# Patient Record
Sex: Male | Born: 1975 | Hispanic: Yes | Marital: Married | Smoking: Current some day smoker
Health system: Southern US, Community
[De-identification: ages and names within clinical notes are randomized; demographics above are authoritative.]

## PROBLEM LIST (undated history)

## (undated) DIAGNOSIS — I10 Essential (primary) hypertension: Secondary | ICD-10-CM

## (undated) HISTORY — PX: APPENDECTOMY: SHX54

---

## 2012-07-09 ENCOUNTER — Emergency Department: Payer: Self-pay | Admitting: Internal Medicine

## 2013-02-07 ENCOUNTER — Emergency Department: Payer: Self-pay | Admitting: Emergency Medicine

## 2013-02-12 ENCOUNTER — Emergency Department: Payer: Self-pay | Admitting: Emergency Medicine

## 2015-03-15 ENCOUNTER — Emergency Department (HOSPITAL_COMMUNITY)
Admission: EM | Admit: 2015-03-15 | Discharge: 2015-03-15 | Disposition: A | Discharge status: Home / Self Care | Payer: Worker's Compensation | Attending: Emergency Medicine | Admitting: Emergency Medicine

## 2015-03-15 ENCOUNTER — Emergency Department (HOSPITAL_COMMUNITY): Payer: Worker's Compensation

## 2015-03-15 ENCOUNTER — Encounter (HOSPITAL_COMMUNITY): Payer: Self-pay

## 2015-03-15 DIAGNOSIS — S29001A Unspecified injury of muscle and tendon of front wall of thorax, initial encounter: Secondary | ICD-10-CM | POA: Diagnosis present

## 2015-03-15 DIAGNOSIS — S50311A Abrasion of right elbow, initial encounter: Secondary | ICD-10-CM | POA: Diagnosis not present

## 2015-03-15 DIAGNOSIS — Y998 Other external cause status: Secondary | ICD-10-CM | POA: Insufficient documentation

## 2015-03-15 DIAGNOSIS — S2231XA Fracture of one rib, right side, initial encounter for closed fracture: Secondary | ICD-10-CM | POA: Diagnosis not present

## 2015-03-15 DIAGNOSIS — Y92410 Unspecified street and highway as the place of occurrence of the external cause: Secondary | ICD-10-CM | POA: Diagnosis not present

## 2015-03-15 DIAGNOSIS — F17200 Nicotine dependence, unspecified, uncomplicated: Secondary | ICD-10-CM | POA: Insufficient documentation

## 2015-03-15 DIAGNOSIS — R0602 Shortness of breath: Secondary | ICD-10-CM | POA: Insufficient documentation

## 2015-03-15 DIAGNOSIS — Y9389 Activity, other specified: Secondary | ICD-10-CM | POA: Insufficient documentation

## 2015-03-15 LAB — I-STAT CHEM 8, ED
BUN: 18 mg/dL (ref 6–20)
CALCIUM ION: 1.19 mmol/L (ref 1.12–1.23)
CHLORIDE: 104 mmol/L (ref 101–111)
CREATININE: 0.8 mg/dL (ref 0.61–1.24)
GLUCOSE: 114 mg/dL — AB (ref 65–99)
HCT: 46 % (ref 39.0–52.0)
Hemoglobin: 15.6 g/dL (ref 13.0–17.0)
Potassium: 4.3 mmol/L (ref 3.5–5.1)
SODIUM: 139 mmol/L (ref 135–145)
TCO2: 22 mmol/L (ref 0–100)

## 2015-03-15 MED ORDER — IBUPROFEN 800 MG PO TABS: 800.0000 mg | ORAL_TABLET | Freq: Three times a day (TID) | ORAL | Status: DC

## 2015-03-15 MED ORDER — MORPHINE SULFATE (PF) 4 MG/ML IV SOLN
4.0000 mg | Freq: Once | INTRAVENOUS | Status: AC
  Administered 2015-03-15: 4 mg via INTRAVENOUS
  Filled 2015-03-15: qty 1

## 2015-03-15 MED ORDER — KETOROLAC TROMETHAMINE 15 MG/ML IJ SOLN
15.0000 mg | Freq: Once | INTRAMUSCULAR | Status: AC
  Administered 2015-03-15: 15 mg via INTRAVENOUS
  Filled 2015-03-15: qty 1

## 2015-03-15 MED ORDER — HYDROCODONE-ACETAMINOPHEN 5-325 MG PO TABS: 2.0000 | ORAL_TABLET | ORAL | Status: DC | PRN

## 2015-03-15 NOTE — ED Notes (Signed)
Pt verbalized understanding of d/c instructions, prescriptions, and follow-up care. No further questions/concerns, VSS, ambulatory w/ steady gait (refused wheelchair) 

## 2015-03-15 NOTE — Discharge Instructions (Signed)
Take your medications as prescribed as needed for pain relief. I also recommend continuing to use your incentive spirometer multiple times daily. Please follow up with a primary care provider from the Resource Guide provided below in 1 week. Please return to the Emergency Department if symptoms worsen or new onset of fever, chest pain, difficulty breathing, cough, vomiting, coughing/vomiting blood.   Emergency Department Resource Guide 1) Find a Doctor and Pay Out of Pocket Although you won't have to find out who is covered by your insurance plan, it is a good idea to ask around and get recommendations. You will then need to call the office and see if the doctor you have chosen will accept you as a new patient and what types of options they offer for patients who are self-pay. Some doctors offer discounts or will set up payment plans for their patients who do not have insurance, but you will need to ask so you aren't surprised when you get to your appointment.  2) Contact Your Local Health Department Not all health departments have doctors that can see patients for sick visits, but many do, so it is worth a call to see if yours does. If you don't know where your local health department is, you can check in your phone book. The CDC also has a tool to help you locate your state's health department, and many state websites also have listings of all of their local health departments.  3) Find a Walk-in Clinic If your illness is not likely to be very severe or complicated, you may want to try a walk in clinic. These are popping up all over the country in pharmacies, drugstores, and shopping centers. They're usually staffed by nurse practitioners or physician assistants that have been trained to treat common illnesses and complaints. They're usually fairly quick and inexpensive. However, if you have serious medical issues or chronic medical problems, these are probably not your best option.  No Primary Care  Doctor: - Call Health Connect at  [phone] - they can help you locate a primary care doctor that  accepts your insurance, provides certain services, etc. - Physician Referral Service- [phone]  Chronic Pain Problems: Organization         Address  Phone   Notes  [facility]  [phone] Patients need to be referred by their primary care doctor.   Medication Assistance: Organization         Address  Phone   Notes  [facility] [address] [place], [place] [id number] [phone]) [phone] --Must be a resident of [facility] [place] -- Must have NO insurance coverage whatsoever (no Medicaid/ Medicare, etc.) -- The pt. MUST have a primary care doctor that directs their care regularly and follows them in the community   MedAssist  [phone]   [employer]  [phone]    Agencies that provide inexpensive medical care: Organization         Address  Phone   Notes  [facility]  [phone]) [phone]   [facility]    [phone]) [phone]   [facility] [address] [place], [place] [address] [phone]   [facility] [address] [place], [place] [phone]) [phone]   [facility]anned Parenthood    [phone](336) [phone]   [facility]    [phone]   [facility]  [address], [place] Phone:  [phone], Fax:  [phone] Hours of Operation:  9 am - 6 pm, M-F.  Also accepts Medicaid/Medicare and self-pay.  [facility] for [place] [address], [address], [place] Phone: [phone], Fax: [phone]. Hours of Operation:  8:30 am - 5:30 pm, M-F.  Also accepts Medicaid and self-pay.  [facility] [place] [address], [place] Phone: [phone]   [place], [place], [place] [phone], Ext. [phone] Mondays & Thursdays: 7-9 AM.  First 15 patients are seen on a first  come, first serve basis.    [place] Providers:  Organization         Address  Phone   Notes  [facility] [address], [place] [phone] Also accepts self-pay patients.  [facility] [id number] [place], [place]  [phone]   [place], [address], [place] [phone]   [facility] [address], [place] [phone]   Lucianne Lei [address], [place]   [phone] Only accepts [place] Access [place] patients after they have their name applied to their card.   Self-Pay (no insurance) in [facility]:  Organization         Address  Phone   Notes  Sickle Cell Patients, [facility] [place] [phone]   [facility] [place] [phone]   [facility] [place]  [place], [place], [place] [phone]   [facility]/Dr. Osei-Bonsu  [address], [place] or [address], [place] [phone] Phone number for both [place] and [place] locations is the same.  [facility]gent [facility] [address] [place] [phone]   [facility] [address] [place] or [address] [phone]   [facility] [address] [place] [phone], phone; [phone], fax Sees patients 1st and 3rd Saturday of every month.  Must not qualify for public or private insurance (i.e. Medicaid, Medicare, [place] Health Choice, Veterans' Benefits)  Household income should be no more than 200% of the poverty level The clinic cannot treat you if you are pregnant or think you are pregnant  Sexually transmitted diseases are not treated at the clinic.    Dental Care: Organization          Address  Phone  Notes  [facility] [phone] Accepts children up to age 79 who are enrolled in [place] or [place]; pregnant women with a Medicaid card; and children who have applied for Medicaid or [place] Health Choice, but were declined, whose parents can pay a reduced fee at time of service.  [facility]  [address], [place] [phone] Accepts children up to age 29 who are enrolled in [place] or [place]; pregnant women with a Medicaid card; and children who have applied for Medicaid or [place] Health Choice, but were declined, whose parents can pay a reduced fee at time of service.  [facility]  [place] [phone] Patients are seen by appointment only. Walk-ins  are not accepted. [place] will see patients 83 years of age and older. Monday - Tuesday (8am-5pm) Most Wednesdays (8:30-5pm) $30 per visit, cash only  [facility] Access PRO[facility]  [address], [facility] [phone] Patients are seen by appointment only. Walk-ins are not accepted. [place] will see patients 74 years of age and older. One Wednesday Evening (Monthly: Volunteer Based).  $30 per visit, cash only  [place]  [phone] for adults; Children under age 26, call Graduate Pediatric Dentistry at [phone]. Children aged 68-14, please call [phone] to request a pediatric application.  Dental services are provided in all areas of dental care including fillings, crowns and bridges, complete and partial dentures, implants, gum treatment, root canals, and extractions. Preventive care is also provided. Treatment is provided to both adults and children. Patients are selected via a lottery and there is often a waiting list.   [facility] [address] [place]  [phone] [email or web address]   [facility] [address] [place], [place] [phone], Ext. [phone] Second and Fourth Thursday of each month, opens at 6:30 AM; Clinic ends at 9 AM.  Patients are seen on a first-come first-served basis, and a limited number are seen during each clinic.   [facility]  [address] Hillard Danker [place], [place] [phone]   Eligibility Requirements You must have lived in [place], [place], or [place] counties for at least the last three months.   You cannot be eligible for state or federal sponsored [employer], including [employer], [place], or [employer].   You generally cannot be eligible for healthcare insurance through your employer.    How to apply: Eligibility screenings are held every Tuesday and Wednesday afternoon from 1:00 pm until 4:00 pm. You do not need an appointment for the interview!  [facility] [address] [place], [place]   [place]  [place] Department  [place]  [phone]    Behavioral Health Resources in the Community: Intensive Outpatient Programs Organization         Address  Phone  Notes  [place]. [address] [place], [place] [phone]   [facility]patient [address] [place], [place]   ADS: Alcohol & Drug Svcs [address] [place], [place]   [place] [address],  [place], [place] or [phone]   Substance Abuse Resources Organization         Address  Phone  Notes  Alcohol and Drug Services  [phone]   [place]  [phone]   [facility] [place]   Chinita Pester  [phone]   Residential & Outpatient Substance Abuse Program  [phone]   Psychological  Services Organization         Address  Phone  Notes  [facility] [place]  [place]  [phone]   [place] [address] [place] [phone] or [phone]    Mobile Crisis Teams Organization         Address  Phone  Notes  [facility], Mobile Crisis Care Unit  [phone]   [facility]ive Psychotherapeutic Services  [address] [place], [place]   [facility] [address] [place] [phone]  Self-Help/Support Groups Organization         Address  Phone             Notes  [facility] of [place] - variety of support groups  [place] Call for more information  Narcotics Anonymous (NA), [facility] [address], [employer]   2 meetings at this location   Special educational needs teacher         Address  Phone  Notes  ASAP Residential Treatment [place],    [place]  [phone]   [facility]  [address] [place] [id number], [place], [place]   [place], [place] [phone] Admissions: 8am-3pm M-F  Incentives Substance [place] [address],    [place], [place] [phone]   [facility] [address] [place], [place], [place]   [facility] [address],  [place], [place]   [facility] Programs - Intensive Outpatient [address] Krist[address], [place], [place]   [facility] ([place] [address] [place].,  [place], [place] [phone] or [phone]   [facility] Services (RTS) [address] [place], [place] Accepts Medicaid  [facility] [place] [address],  [place] [phone] Substance Abuse/Addiction Treatment   [facility] Organization         Address  Phone  Notes  [facility]  [phone]   Domenic Schwab, PhD [address] Arlis Porta [place], [place]   [phone] or [phone]   [place], [place] [phone]   [facility] [address], [place], [place] [phone] Insurance/Medicaid/sponsorship through [facility] and Families [address], [address]                                    [place], [place] [phone] [place] [address], [place] [phone]    Dr. Adele Schilder  [phone]   [facility] [place] Dept. 1) [address],  2) [place] 3)  [place] [address], [place] [phone]  [phone]   [place] [phone] or [phone] (After Hours)
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
## 2015-03-24 ENCOUNTER — Emergency Department (HOSPITAL_COMMUNITY)
Admission: EM | Admit: 2015-03-24 | Discharge: 2015-03-24 | Disposition: A | Discharge status: Home / Self Care | Payer: Worker's Compensation | Attending: Emergency Medicine | Admitting: Emergency Medicine

## 2015-03-24 ENCOUNTER — Emergency Department (HOSPITAL_COMMUNITY): Payer: Worker's Compensation

## 2015-03-24 ENCOUNTER — Encounter (HOSPITAL_COMMUNITY): Payer: Self-pay | Admitting: Emergency Medicine

## 2015-03-24 DIAGNOSIS — F17200 Nicotine dependence, unspecified, uncomplicated: Secondary | ICD-10-CM | POA: Insufficient documentation

## 2015-03-24 DIAGNOSIS — S3992XD Unspecified injury of lower back, subsequent encounter: Secondary | ICD-10-CM | POA: Insufficient documentation

## 2015-03-24 DIAGNOSIS — Z791 Long term (current) use of non-steroidal anti-inflammatories (NSAID): Secondary | ICD-10-CM | POA: Insufficient documentation

## 2015-03-24 DIAGNOSIS — S29001D Unspecified injury of muscle and tendon of front wall of thorax, subsequent encounter: Secondary | ICD-10-CM | POA: Diagnosis present

## 2015-03-24 DIAGNOSIS — S2231XD Fracture of one rib, right side, subsequent encounter for fracture with routine healing: Secondary | ICD-10-CM | POA: Insufficient documentation

## 2015-03-24 MED ORDER — HYDROCODONE-ACETAMINOPHEN 5-325 MG PO TABS
1.0000 | ORAL_TABLET | ORAL | Status: DC | PRN
Start: 2015-03-24 — End: 2015-04-21
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
## 2015-03-24 NOTE — Discharge Instructions (Signed)
Take the prescribed medication as directed.  Your bruit fracture is healing, it will take several weeks before it is back to normal. You can expect to have some soreness during this time which is normal. Follow-up with your primary care physician. Return to the ED for new or worsening symptoms.  Rib Fracture A rib fracture is a break or crack in one of the bones of the ribs. The ribs are a group of long, curved bones that wrap around your chest and attach to your spine. They protect your lungs and other organs in the chest cavity. A broken or cracked rib is often painful, but most do not cause other problems. Most rib fractures heal on their own over time. However, rib fractures can be more serious if multiple ribs are broken or if broken ribs move out of place and push against other structures. CAUSES   A direct blow to the chest. For example, this could happen during contact sports, a car accident, or a fall against a hard object.  Repetitive movements with high force, such as pitching a baseball or having severe coughing spells. SYMPTOMS   Pain when you breathe in or cough.  Pain when someone presses on the injured area. DIAGNOSIS  Your caregiver will perform a physical exam. Various imaging tests may be ordered to confirm the diagnosis and to look for related injuries. These tests may include a chest X-ray, computed tomography (CT), magnetic resonance imaging (MRI), or a bone scan. TREATMENT  Rib fractures usually heal on their own in 1-3 months. The longer healing period is often associated with a continued cough or other aggravating activities. During the healing period, pain control is very important. Medication is usually given to control pain. Hospitalization or surgery may be needed for more severe injuries, such as those in which multiple ribs are broken or the ribs have moved out of place.  HOME CARE INSTRUCTIONS   Avoid strenuous activity and any activities or movements that cause  pain. Be careful during activities and avoid bumping the injured rib.  Gradually increase activity as directed by your caregiver.  Only take over-the-counter or prescription medications as directed by your caregiver. Do not take other medications without asking your caregiver first.  Apply ice to the injured area for the first 1-2 days after you have been treated or as directed by your caregiver. Applying ice helps to reduce inflammation and pain.  Put ice in a plastic bag.  Place a towel between your skin and the bag.   Leave the ice on for 15-20 minutes at a time, every 2 hours while you are awake.  Perform deep breathing as directed by your caregiver. This will help prevent pneumonia, which is a common complication of a broken rib. Your caregiver may instruct you to:  Take deep breaths several times a day.  Try to cough several times a day, holding a pillow against the injured area.  Use a device called an incentive spirometer to practice deep breathing several times a day.  Drink enough fluids to keep your urine clear or pale yellow. This will help you avoid constipation.   Do not wear a rib belt or binder. These restrict breathing, which can lead to pneumonia.  SEEK IMMEDIATE MEDICAL CARE IF:   You have a fever.   You have difficulty breathing or shortness of breath.   You develop a continual cough, or you cough up thick or bloody sputum.  You feel sick to your stomach (nausea), throw  up (vomit), or have abdominal pain.   You have worsening pain not controlled with medications.  MAKE SURE YOU:  Understand these instructions.  Will watch your condition.  Will get help right away if you are not doing well or get worse.   This information is not intended to replace advice given to you by your health care provider. Make sure you discuss any questions you have with your health care provider.   Document Released: 04/01/2005 Document Revised: 12/02/2012 Document  Reviewed: 06/03/2012 Elsevier Interactive Patient Education [employer]2016 Elsevier Inc.
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
## 2015-03-24 NOTE — ED Notes (Signed)
This RN went in to try to discharge the patient.  Patient's wife is speaking through her husband, and is wanting mulitiple questions answered.  PA made aware and at bedside updating family at this time.

## 2015-03-24 NOTE — ED Notes (Signed)
Pt able to ambulate independently 

## 2015-03-27 ENCOUNTER — Ambulatory Visit: Payer: Worker's Compensation | Attending: Family Medicine | Admitting: Family Medicine

## 2015-03-27 ENCOUNTER — Encounter: Payer: Self-pay | Admitting: Family Medicine

## 2015-03-27 VITALS — BP 164/122 | HR 101 | Temp 97.9°F | Resp 15 | Ht 71.0 in | Wt 201.6 lb

## 2015-03-27 DIAGNOSIS — S2231XA Fracture of one rib, right side, initial encounter for closed fracture: Secondary | ICD-10-CM | POA: Diagnosis not present

## 2015-03-27 DIAGNOSIS — Z0000 Encounter for general adult medical examination without abnormal findings: Secondary | ICD-10-CM | POA: Insufficient documentation

## 2015-03-27 DIAGNOSIS — R0789 Other chest pain: Secondary | ICD-10-CM | POA: Insufficient documentation

## 2015-03-27 DIAGNOSIS — F17210 Nicotine dependence, cigarettes, uncomplicated: Secondary | ICD-10-CM | POA: Diagnosis not present

## 2015-03-27 DIAGNOSIS — IMO0001 Reserved for inherently not codable concepts without codable children: Secondary | ICD-10-CM

## 2015-03-27 DIAGNOSIS — R030 Elevated blood-pressure reading, without diagnosis of hypertension: Secondary | ICD-10-CM | POA: Diagnosis not present

## 2015-03-27 DIAGNOSIS — M79601 Pain in right arm: Secondary | ICD-10-CM | POA: Diagnosis not present

## 2015-03-27 DIAGNOSIS — R0781 Pleurodynia: Secondary | ICD-10-CM | POA: Diagnosis not present

## 2015-03-27 DIAGNOSIS — M5441 Lumbago with sciatica, right side: Secondary | ICD-10-CM

## 2015-03-27 DIAGNOSIS — M545 Low back pain: Secondary | ICD-10-CM | POA: Diagnosis not present

## 2015-03-27 MED ORDER — ACETAMINOPHEN-CODEINE #3 300-30 MG PO TABS: 1.0000 | ORAL_TABLET | Freq: Four times a day (QID) | ORAL | Status: AC | PRN

## 2015-03-27 MED ORDER — METHOCARBAMOL 750 MG PO TABS: 750.0000 mg | ORAL_TABLET | Freq: Three times a day (TID) | ORAL | Status: AC | PRN
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
## 2015-03-27 NOTE — Patient Instructions (Signed)
Fracturas de costilla  (Rib Fracture)  Una fractura de costilla es la ruptura de uno de los huesos que la forman. Las costillas son un grupo de huesos largos y curvos que rodean el pecho y estn adheridos a la columna vertebral. Protegen a los pulmones y a otros rganos que se encuentran en la cavidad torcica. La fractura o fisura de una costilla puede ser dolorosa pero no causa otros problemas. La mayora de las fracturas de costillas se curan por s mismas luego de un tiempo. Sin embargo, puede llegar a ser ms grave si se rompen varias costillas o si se desplazan y empujan otras estructuras. CAUSAS   Un golpe directo en el pecho. Por ejemplo, esto puede ocurrir durante la prctica de deportes de contacto, un accidente automovilstico o una cada sobre un objeto duro.  Los movimientos repetitivos con una gran fuerza, como al lanzar una pelota en el bisbol, o tener episodios de tos intensos. SNTOMAS   Dolor al respirar o al toser.  Dolor cuando alguien presiona la zona lesionada. DIAGNSTICO  El mdico le har un examen fsico. Le indicar diferentes estudios por imgenes para confirmar el diagnstico y buscar lesiones relacionadas. Estos estudios incluyen radiografas de trax, tomografa computada (TC), resonancia magntica (MRI) o gammagrafa sea.  TRATAMIENTO  La mayora de las fracturas de costillas se curan por s mismas en 1 a 3 meses. Los perodos de curacin ms prolongados generalmente se asocian a tos continua o a otras actividades que agravan el problema. Durante el perodo de curacin es muy importante el control del dolor. Generalmente se recetan medicamentos para controlar el dolor. Ser necesaria la hospitalizacin o una ciruga si hay lesiones ms graves, como las fracturas de mltiples costillas o aquellas en las que las costillas se desplazan.  INSTRUCCIONES PARA EL CUIDADO EN EL HOGAR   Evite las actividades extenuantes y toda actividad o movimiento que le cause dolor.  Realice las actividades con cuidado y evite golpearse las costillas lesionadas.  Reanude la actividad sexual cuando le indique su mdico.  Tome slo medicamentos de venta libre o recetados, segn las indicaciones del mdico. No tome otros medicamentos sin consultar a su mdico.  Aplique hielo en la zona lesionada durante los primeros 1 a 2 das despus de haber recibido tratamiento o segn las indicaciones de su mdico. La aplicacin del hielo ayuda a reducir la inflamacin y el dolor.  Ponga el hielo en una bolsa plstica.  Colquese una toalla entre la piel y la bolsa de hielo.   Deje el hielo durante 15 a 20 minutos, cada 2 horas mientras se encuentre despierto.  Haga ejercicios de respiracin como le indique su mdico. Esto ayudar a evitar la neumona, que es una complicacin frecuente en las fracturas de costilla. El mdico le indicar que:  Haga respiraciones profundas varias veces al da.  Trate de toser varias veces al da, sosteniendo el rea lesionada con una almohada.  Use un dispositivo llamado espirmetro incentivo para realizar respiraciones profundas varias veces por da.  Beba suficiente lquido para mantener la orina clara o de color amarillo plido. Esto le ayudar a evitar el estreimiento.   No use cinturones ni sujetadores. Esta limitacin de la respiracin puede causar neumona.  SOLICITE ATENCIN MDICA DE INMEDIATO SI:   Tiene fiebre.  Tiene dificultad para respirar o le falta el aire.   Tiene tos continua o elimina moco espeso o sanguinolento.  Tiene malestar estomacal (nuseas), devuelve (vomita), o tiene dolor abdominal.   El dolor   falta el aire.    Tiene tos continua o elimina moco espeso o sanguinolento.   Tiene malestar estomacal (nuseas), devuelve (vomita), o tiene dolor abdominal.    El dolor aumenta y no se alivia con los medicamentos.   ASEGRESE DE QUE:    Comprende estas instrucciones.   Controlar su enfermedad.   Recibir ayuda de inmediato si no mejora o si empeora.     Esta informacin no tiene como fin reemplazar el consejo del mdico. Asegrese de hacerle al mdico cualquier pregunta que tenga.      Document Released: 01/09/2005 Document Revised: 12/02/2012  Elsevier Interactive Patient Education 2016 Elsevier Inc.
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
## 2015-04-18 ENCOUNTER — Ambulatory Visit: Payer: Self-pay | Admitting: Family Medicine

## 2015-04-21 ENCOUNTER — Ambulatory Visit: Payer: Self-pay | Attending: Family Medicine | Admitting: Family Medicine

## 2015-04-21 ENCOUNTER — Encounter: Payer: Self-pay | Admitting: Family Medicine

## 2015-04-21 VITALS — BP 158/130 | HR 93 | Temp 97.9°F | Resp 13 | Ht 71.0 in | Wt 207.8 lb

## 2015-04-21 DIAGNOSIS — Z720 Tobacco use: Secondary | ICD-10-CM

## 2015-04-21 DIAGNOSIS — M5431 Sciatica, right side: Secondary | ICD-10-CM

## 2015-04-21 DIAGNOSIS — F17200 Nicotine dependence, unspecified, uncomplicated: Secondary | ICD-10-CM | POA: Insufficient documentation

## 2015-04-21 DIAGNOSIS — M5441 Lumbago with sciatica, right side: Secondary | ICD-10-CM | POA: Insufficient documentation

## 2015-04-21 DIAGNOSIS — M5430 Sciatica, unspecified side: Secondary | ICD-10-CM | POA: Insufficient documentation

## 2015-04-21 DIAGNOSIS — S2231XS Fracture of one rib, right side, sequela: Secondary | ICD-10-CM | POA: Insufficient documentation

## 2015-04-21 DIAGNOSIS — S2231XA Fracture of one rib, right side, initial encounter for closed fracture: Secondary | ICD-10-CM

## 2015-04-21 DIAGNOSIS — I10 Essential (primary) hypertension: Secondary | ICD-10-CM

## 2015-04-21 MED ORDER — AMLODIPINE BESYLATE 10 MG PO TABS: 10.0000 mg | ORAL_TABLET | Freq: Every day | ORAL | Status: AC
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
## 2015-04-21 NOTE — Progress Notes (Signed)
Patient here for follow up He reports right sided lower back pain radiating into leg Right sided rib pain that radiates into his back He reports medications have helped a little but pain is still present He is still out of work/worker's compensation

## 2015-04-21 NOTE — Patient Instructions (Signed)
Hypertension Hypertension, commonly called high blood pressure, is when the force of blood pumping through your arteries is too strong. Your arteries are the blood vessels that carry blood from your heart throughout your body. A blood pressure reading consists of a higher number over a lower number, such as 110/72. The higher number (systolic) is the pressure inside your arteries when your heart pumps. The lower number (diastolic) is the pressure inside your arteries when your heart relaxes. Ideally you want your blood pressure below 120/80. Hypertension forces your heart to work harder to pump blood. Your arteries may become narrow or stiff. Having untreated or uncontrolled hypertension can cause heart attack, stroke, kidney disease, and other problems. RISK FACTORS Some risk factors for high blood pressure are controllable. Others are not.  Risk factors you cannot control include:   Race. You may be at higher risk if you are African American.  Age. Risk increases with age.  Gender. Men are at higher risk than women before age 45 years. After age 65, women are at higher risk than men. Risk factors you can control include:  Not getting enough exercise or physical activity.  Being overweight.  Getting too much fat, sugar, calories, or salt in your diet.  Drinking too much alcohol. SIGNS AND SYMPTOMS Hypertension does not usually cause signs or symptoms. Extremely high blood pressure (hypertensive crisis) may cause headache, anxiety, shortness of breath, and nosebleed. DIAGNOSIS To check if you have hypertension, your health care provider will measure your blood pressure while you are seated, with your arm held at the level of your heart. It should be measured at least twice using the same arm. Certain conditions can cause a difference in blood pressure between your right and left arms. A blood pressure reading that is higher than normal on one occasion does not mean that you need treatment. If  it is not clear whether you have high blood pressure, you may be asked to return on a different day to have your blood pressure checked again. Or, you may be asked to monitor your blood pressure at home for 1 or more weeks. TREATMENT Treating high blood pressure includes making lifestyle changes and possibly taking medicine. Living a healthy lifestyle can help lower high blood pressure. You may need to change some of your habits. Lifestyle changes may include:  Following the DASH diet. This diet is high in fruits, vegetables, and whole grains. It is low in salt, red meat, and added sugars.  Keep your sodium intake below 2,300 mg per day.  Getting at least 30-45 minutes of aerobic exercise at least 4 times per week.  Losing weight if necessary.  Not smoking.  Limiting alcoholic beverages.  Learning ways to reduce stress. Your health care provider may prescribe medicine if lifestyle changes are not enough to get your blood pressure under control, and if one of the following is true:  You are 18-59 years of age and your systolic blood pressure is above 140.  You are 60 years of age or older, and your systolic blood pressure is above 150.  Your diastolic blood pressure is above 90.  You have diabetes, and your systolic blood pressure is over 140 or your diastolic blood pressure is over 90.  You have kidney disease and your blood pressure is above 140/90.  You have heart disease and your blood pressure is above 140/90. Your personal target blood pressure may vary depending on your medical conditions, your age, and other factors. HOME CARE INSTRUCTIONS    Have your blood pressure rechecked as directed by your health care provider.   Take medicines only as directed by your health care provider. Follow the directions carefully. Blood pressure medicines must be taken as prescribed. The medicine does not work as well when you skip doses. Skipping doses also puts you at risk for  problems.  Do not smoke.   Monitor your blood pressure at home as directed by your health care provider. SEEK MEDICAL CARE IF:   You think you are having a reaction to medicines taken.  You have recurrent headaches or feel dizzy.  You have swelling in your ankles.  You have trouble with your vision. SEEK IMMEDIATE MEDICAL CARE IF:  You develop a severe headache or confusion.  You have unusual weakness, numbness, or feel faint.  You have severe chest or abdominal pain.  You vomit repeatedly.  You have trouble breathing. MAKE SURE YOU:   Understand these instructions.  Will watch your condition.  Will get help right away if you are not doing well or get worse.   This information is not intended to replace advice given to you by your health care provider. Make sure you discuss any questions you have with your health care provider.   Document Released: 04/01/2005 Document Revised: 08/16/2014 Document Reviewed: 01/22/2013 Elsevier Interactive Patient Education 2016 [employer]  

## 2015-05-09 ENCOUNTER — Other Ambulatory Visit: Payer: Self-pay | Admitting: Specialist

## 2015-05-09 DIAGNOSIS — M5431 Sciatica, right side: Secondary | ICD-10-CM

## 2015-05-20 ENCOUNTER — Inpatient Hospital Stay: Admission: RE | Admit: 2015-05-20 | Payer: Self-pay | Source: Ambulatory Visit

## 2018-11-24 ENCOUNTER — Other Ambulatory Visit: Payer: Self-pay

## 2018-11-24 ENCOUNTER — Emergency Department
Admission: EM | Admit: 2018-11-24 | Discharge: 2018-11-24 | Disposition: A | Discharge status: Home / Self Care | Payer: Self-pay | Attending: Student in an Organized Health Care Education/Training Program | Admitting: Student in an Organized Health Care Education/Training Program

## 2018-11-24 ENCOUNTER — Encounter: Payer: Self-pay | Admitting: Emergency Medicine

## 2018-11-24 DIAGNOSIS — F17200 Nicotine dependence, unspecified, uncomplicated: Secondary | ICD-10-CM | POA: Insufficient documentation

## 2018-11-24 DIAGNOSIS — I10 Essential (primary) hypertension: Secondary | ICD-10-CM | POA: Insufficient documentation

## 2018-11-24 DIAGNOSIS — N3090 Cystitis, unspecified without hematuria: Secondary | ICD-10-CM | POA: Insufficient documentation

## 2018-11-24 DIAGNOSIS — Z79899 Other long term (current) drug therapy: Secondary | ICD-10-CM | POA: Insufficient documentation

## 2018-11-24 HISTORY — DX: Essential (primary) hypertension: I10

## 2018-11-24 LAB — CHLAMYDIA/NGC RT PCR (ARMC ONLY): N gonorrhoeae: NOT DETECTED

## 2018-11-24 LAB — URINALYSIS, COMPLETE (UACMP) WITH MICROSCOPIC
Bilirubin Urine: NEGATIVE
Glucose, UA: NEGATIVE mg/dL
Ketones, ur: NEGATIVE mg/dL
Nitrite: NEGATIVE
Protein, ur: NEGATIVE mg/dL
Specific Gravity, Urine: 1.016 (ref 1.005–1.030)
WBC, UA: >50 WBC/hpf — ABNORMAL HIGH (ref 0–5)
pH: 5 (ref 5.0–8.0)

## 2018-11-24 LAB — CHLAMYDIA/NGC RT PCR (ARMC ONLY)??????????: Chlamydia Tr: NOT DETECTED

## 2018-11-24 MED ORDER — OXYBUTYNIN CHLORIDE ER 10 MG PO TB24: 10.0000 mg | ORAL_TABLET | Freq: Every day | ORAL | 0 refills | Status: AC

## 2018-11-24 MED ORDER — DOXYCYCLINE MONOHYDRATE 100 MG PO CAPS: 100.0000 mg | ORAL_CAPSULE | Freq: Two times a day (BID) | ORAL | 0 refills | Status: AC

## 2018-11-24 MED ORDER — PHENAZOPYRIDINE HCL 200 MG PO TABS: 200.0000 mg | ORAL_TABLET | Freq: Three times a day (TID) | ORAL | 0 refills | Status: AC | PRN

## 2018-11-24 NOTE — ED Triage Notes (Signed)
Pt presents to ED via POV with c/o urinary frequency and dysuria x 2 days.

## 2018-11-24 NOTE — Discharge Instructions (Signed)
Follow discharge care instruction take medication as directed.  Advised to call urologist to schedule appointment for definitive evaluation and treatment.  One of your medication may cause urine to be red/orange.

## 2018-11-24 NOTE — ED Provider Notes (Signed)
[facility] Emergency Department Provider Note   ____________________________________________   First MD Initiated Contact with Patient 11/24/18 1132     (approximate)  I have reviewed the triage vital signs and the nursing notes.   HISTORY  Chief Complaint Dysuria    HPI Justin Cunningham is a 43 y.o. male patient presents with 2 days of urinary frequency and dysuria.  Patient denies urethral discharge.  Patient is in a monogamous relationship with no history of STD.  Patient denies flank pain.  Patient rates his pain as a 7/10.  Patient described pain is "burning".  No palliative measure for complaint.        Past Medical History:  Diagnosis Date  . Hypertension     Patient Active Problem List   Diagnosis Date Noted  . Essential hypertension 04/21/2015  . Sciatica 04/21/2015  . Tobacco abuse 04/21/2015  . Fracture of rib of right side 03/27/2015    History reviewed. No pertinent surgical history.  Prior to Admission medications   Medication Sig Start Date End Date Taking? Authorizing Provider  acetaminophen-codeine (TYLENOL #3) 300-30 MG tablet Take 1-2 tablets by mouth every 6 (six) hours as needed for moderate pain. 03/27/15   Hoy RegisterNewlin, Enobong, MD  amLODipine (NORVASC) 10 MG tablet Take 1 tablet (10 mg total) by mouth daily. 04/21/15   Hoy RegisterNewlin, Enobong, MD  doxycycline (MONODOX) 100 MG capsule Take 1 capsule (100 mg total) by mouth 2 (two) times daily. 11/24/18   Joni ReiningSmith, Blanche Scovell K, PA-C  methocarbamol (ROBAXIN-750) 750 MG tablet Take 1 tablet (750 mg total) by mouth every 8 (eight) hours as needed for muscle spasms. 03/27/15   Hoy RegisterNewlin, Enobong, MD  oxybutynin (DITROPAN XL) 10 MG 24 hr tablet Take 1 tablet (10 mg total) by mouth daily. 11/24/18 11/24/19  Joni ReiningSmith, Laverle Pillard K, PA-C  phenazopyridine (PYRIDIUM) 200 MG tablet Take 1 tablet (200 mg total) by mouth 3 (three) times daily as needed for pain. 11/24/18   Joni ReiningSmith, Bleu Minerd K, PA-C    Allergies  Patient has no known allergies.  History reviewed. No pertinent family history.  Social History Social History   Tobacco Use  . Smoking status: Current Some Day Smoker    Packs/day: 0.25  . Smokeless tobacco: Never Used  Substance Use Topics  . Alcohol use: No  . Drug use: No    Types: Marijuana    Review of Systems Constitutional: No fever/chills Eyes: No visual changes. ENT: No sore throat. Cardiovascular: Denies chest pain. Respiratory: Denies shortness of breath. Gastrointestinal: No abdominal pain.  No nausea, no vomiting.  No diarrhea.  No constipation. Genitourinary: Positive for dysuria, urinary frequency, and urgency.   Musculoskeletal: Negative for back pain. Skin: Negative for rash. Neurological: Negative for headaches, focal weakness or numbness. Endocrine:  Hypertension  ____________________________________________   PHYSICAL EXAM:  VITAL SIGNS: ED Triage Vitals  Enc Vitals Group     BP 11/24/18 1117 (!) 161/105     Pulse Rate 11/24/18 1117 98     Resp 11/24/18 1117 18     Temp 11/24/18 1117 98.2 F (36.8 C)     Temp Source 11/24/18 1117 Oral     SpO2 11/24/18 1117 95 %     Weight 11/24/18 1118 215 lb (97.5 kg)     Height 11/24/18 1118 5\' 11"  (1.803 m)     Head Circumference --      Peak Flow --      Pain Score 11/24/18 1116 7  Pain Loc --      Pain Edu? --      Excl. in [place]? --    Constitutional: Alert and oriented. Well appearing and in no acute distress. Cardiovascular: Normal rate, regular rhythm. Grossly normal heart sounds.  Good peripheral circulation. Respiratory: Normal respiratory effort.  No retractions. Lungs CTAB. Gastrointestinal: Soft and nontender. No distention. No abdominal bruits. No CVA tenderness. Genitourinary: No external lesions or urethral discharge. Musculoskeletal: No lower extremity tenderness nor edema.  No joint effusions. Neurologic:  Normal speech and language. No gross focal neurologic deficits are  appreciated. No gait instability. Skin:  Skin is warm, dry and intact. No rash noted. Psychiatric: Mood and affect are normal. Speech and behavior are normal.  ____________________________________________   LABS (all labs ordered are listed, but only abnormal results are displayed)  Labs Reviewed  URINALYSIS, COMPLETE (UACMP) WITH MICROSCOPIC - Abnormal; Notable for the following components:      Result Value   Color, Urine YELLOW (*)    APPearance CLOUDY (*)    Hgb urine dipstick SMALL (*)    Leukocytes,Ua LARGE (*)    WBC, UA >50 (*)    Bacteria, UA RARE (*)    All other components within normal limits  CHLAMYDIA/NGC RT PCR ([facility] ONLY)  URINE CULTURE   ____________________________________________  EKG   ____________________________________________  RADIOLOGY  ED MD interpretation:    Official radiology report(s): No results found.  ____________________________________________   PROCEDURES  Procedure(s) performed (including Critical Care):  Procedures   ____________________________________________   INITIAL IMPRESSION / ASSESSMENT AND PLAN / ED COURSE  As part of my medical decision making, I reviewed the following data within the Jarrell was evaluated in Emergency Department on 11/24/2018 for the symptoms described in the history of present illness. He was evaluated in the context of the global COVID-19 pandemic, which necessitated consideration that the patient might be at risk for infection with the SARS-CoV-2 virus that causes COVID-19. Institutional protocols and algorithms that pertain to the evaluation of patients at risk for COVID-19 are in a state of rapid change based on information released by regulatory bodies including the CDC and federal and state organizations. These policies and algorithms were followed during the patient's care in the ED.  Patient presents with 2 days of urinary frequency  and dysuria.  Patient denies urethral discharge.  Differential consist of urinary tract infection and BPH.  Patient urinalysis consistent with urinary tract infection.  Patient given discharge care instructions and consulted to urology.  Take medication as directed.          ____________________________________________   FINAL CLINICAL IMPRESSION(S) / ED DIAGNOSES  Final diagnoses:  Cystitis     ED Discharge Orders         Ordered    doxycycline (MONODOX) 100 MG capsule  2 times daily     11/24/18 1334    oxybutynin (DITROPAN XL) 10 MG 24 hr tablet  Daily     11/24/18 1334    phenazopyridine (PYRIDIUM) 200 MG tablet  3 times daily PRN     11/24/18 1334           Note:  This document was prepared using Dragon voice recognition software and may include unintentional dictation errors.    Sable Feil, PA-C 11/24/18 1341    Merlyn Lot, MD 11/24/18 1415

## 2018-11-26 LAB — URINE CULTURE: Culture: >=100000 — AB

## 2019-07-04 ENCOUNTER — Ambulatory Visit: Payer: Self-pay | Attending: Internal Medicine

## 2019-07-04 DIAGNOSIS — Z23 Encounter for immunization: Secondary | ICD-10-CM

## 2019-07-04 NOTE — Progress Notes (Signed)
   Covid-19 Vaccination Clinic  Name:  Justin Cunningham    MRN: [id number] DOB: 20-Jun-1975  07/04/2019  Mr. Justin Cunningham was observed post Covid-19 immunization for 15 minutes without incident. He was provided with Vaccine Information Sheet and instruction to access the V-Safe system.   Mr. Justin Cunningham was instructed to call 911 with any severe reactions post vaccine: Marland Kitchen Difficulty breathing  . Swelling of face and throat  . A fast heartbeat  . A bad rash all over body  . Dizziness and weakness   Immunizations Administered    Name Date Dose VIS Date Route   Pfizer COVID-19 Vaccine 07/04/2019 12:54 PM 0.3 mL 03/26/2019 Intramuscular   Manufacturer: [employer], [employer]   Lot: [id number]   NDC: [address]-3

## 2019-07-25 ENCOUNTER — Ambulatory Visit: Payer: Self-pay | Attending: Internal Medicine

## 2019-07-25 DIAGNOSIS — Z23 Encounter for immunization: Secondary | ICD-10-CM

## 2019-07-25 NOTE — Progress Notes (Signed)
   Covid-19 Vaccination Clinic  Name:  Justin Cunningham    MRN: [id number] DOB: 1975-05-23  07/25/2019  Mr. Justin Cunningham was observed post Covid-19 immunization for 15 minutes without incident. He was provided with Vaccine Information Sheet and instruction to access the V-Safe system.   Mr. Justin Cunningham was instructed to call 911 with any severe reactions post vaccine: Marland Kitchen Difficulty breathing  . Swelling of face and throat  . A fast heartbeat  . A bad rash all over body  . Dizziness and weakness   Immunizations Administered    Name Date Dose VIS Date Route   Pfizer COVID-19 Vaccine 07/25/2019 12:15 PM 0.3 mL 03/26/2019 Intramuscular   Manufacturer: [employer], [employer]   Lot: [id number]   NDC: [address]-5

## 2019-11-03 ENCOUNTER — Other Ambulatory Visit: Payer: Self-pay

## 2019-11-03 ENCOUNTER — Emergency Department: Payer: Commercial Managed Care - PPO

## 2019-11-03 ENCOUNTER — Encounter: Payer: Self-pay | Admitting: *Deleted

## 2019-11-03 ENCOUNTER — Emergency Department
Admission: EM | Admit: 2019-11-03 | Discharge: 2019-11-03 | Disposition: A | Discharge status: Home / Self Care | Payer: Commercial Managed Care - PPO | Attending: Emergency Medicine | Admitting: Emergency Medicine

## 2019-11-03 DIAGNOSIS — R519 Headache, unspecified: Secondary | ICD-10-CM | POA: Insufficient documentation

## 2019-11-03 DIAGNOSIS — I10 Essential (primary) hypertension: Secondary | ICD-10-CM | POA: Insufficient documentation

## 2019-11-03 DIAGNOSIS — F17210 Nicotine dependence, cigarettes, uncomplicated: Secondary | ICD-10-CM | POA: Diagnosis not present

## 2019-11-03 DIAGNOSIS — Z79899 Other long term (current) drug therapy: Secondary | ICD-10-CM | POA: Insufficient documentation

## 2019-11-03 LAB — COMPREHENSIVE METABOLIC PANEL WITH GFR
ALT: 22 U/L (ref 0–44)
AST: 27 U/L (ref 15–41)
Albumin: 4.5 g/dL (ref 3.5–5.0)
Alkaline Phosphatase: 75 U/L (ref 38–126)
Anion gap: 14 (ref 5–15)
BUN: 14 mg/dL (ref 6–20)
CO2: 19 mmol/L — ABNORMAL LOW (ref 22–32)
Calcium: 9.1 mg/dL (ref 8.9–10.3)
Chloride: 101 mmol/L (ref 98–111)
Creatinine, Ser: 0.94 mg/dL (ref 0.61–1.24)
GFR calc Af Amer: >60 mL/min (ref >60)
GFR calc non Af Amer: >60 mL/min (ref >60)
Glucose, Bld: 200 mg/dL — ABNORMAL HIGH (ref 70–99)
Potassium: 3.5 mmol/L (ref 3.5–5.1)
Sodium: 134 mmol/L — ABNORMAL LOW (ref 135–145)
Total Bilirubin: 0.6 mg/dL (ref 0.3–1.2)
Total Protein: 7.8 g/dL (ref 6.5–8.1)

## 2019-11-03 LAB — CBC WITH DIFFERENTIAL/PLATELET
Abs Immature Granulocytes: 0.06 10*3/uL (ref 0.00–0.07)
Basophils Absolute: 0.1 10*3/uL (ref 0.0–0.1)
Basophils Relative: 1 %
Eosinophils Absolute: 0 10*3/uL (ref 0.0–0.5)
Eosinophils Relative: 0 %
HCT: 43.8 % (ref 39.0–52.0)
Hemoglobin: 15.1 g/dL (ref 13.0–17.0)
Immature Granulocytes: 1 %
Lymphocytes Relative: 16 %
Lymphs Abs: 1.5 10*3/uL (ref 0.7–4.0)
MCH: 30.3 pg (ref 26.0–34.0)
MCHC: 34.5 g/dL (ref 30.0–36.0)
MCV: 87.8 fL (ref 80.0–100.0)
Monocytes Absolute: 0.4 10*3/uL (ref 0.1–1.0)
Monocytes Relative: 4 %
Neutro Abs: 7.5 10*3/uL (ref 1.7–7.7)
Neutrophils Relative %: 78 %
Platelets: 222 10*3/uL (ref 150–400)
RBC: 4.99 MIL/uL (ref 4.22–5.81)
RDW: 12.4 % (ref 11.5–15.5)
WBC: 9.5 10*3/uL (ref 4.0–10.5)
nRBC: 0 % (ref 0.0–0.2)

## 2019-11-03 LAB — URINALYSIS, COMPLETE (UACMP) WITH MICROSCOPIC
Bacteria, UA: NONE SEEN
Bilirubin Urine: NEGATIVE
Glucose, UA: NEGATIVE mg/dL
Ketones, ur: NEGATIVE mg/dL
Nitrite: NEGATIVE
Protein, ur: NEGATIVE mg/dL
Specific Gravity, Urine: 1.001 — ABNORMAL LOW (ref 1.005–1.030)
Squamous Epithelial / HPF: NONE SEEN (ref 0–5)
pH: 6 (ref 5.0–8.0)

## 2019-11-03 LAB — CK: Total CK: 138 U/L (ref 49–397)

## 2019-11-03 MED ORDER — METHYLPREDNISOLONE SODIUM SUCC 125 MG IJ SOLR
125.0000 mg | Freq: Once | INTRAMUSCULAR | Status: AC
  Administered 2019-11-03: 125 mg via INTRAVENOUS
  Filled 2019-11-03: qty 2

## 2019-11-03 MED ORDER — PROCHLORPERAZINE EDISYLATE 10 MG/2ML IJ SOLN
10.0000 mg | Freq: Once | INTRAMUSCULAR | Status: AC
  Administered 2019-11-03: 10 mg via INTRAVENOUS
  Filled 2019-11-03: qty 2

## 2019-11-03 NOTE — ED Notes (Signed)
Report given to Stephen RN.

## 2019-11-03 NOTE — Discharge Instructions (Addendum)
As we discussed please follow up with a primary care doctor to recheck your blood sugar levels. Please seek medical attention for any high fevers, chest pain, shortness of breath, change in behavior, persistent vomiting, bloody stool or any other new or concerning symptoms.

## 2019-11-03 NOTE — ED Triage Notes (Addendum)
Patient c/o headaches for 4 days and nausea. Patient states headache is in the front. 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

## 2019-11-03 NOTE — ED Provider Notes (Signed)
[facility] Emergency Department Provider Note   ____________________________________________   I have reviewed the triage vital signs and the nursing notes.   HISTORY  Chief Complaint Headache   History limited by: Not Limited   HPI Justin Cunningham is a 44 y.o. male who presents to the emergency department today because of concerns for headache.  Is located behind his eyes.  He states that he has been getting a headache on and off for the past 2 months.  However for the past 4 days it has been more constant.  He has tried taking over-the-counter pain medication without any significant relief.  Patient has not noticed any weakness or numbness in his extremities.  He denies any recent head trauma although has history of head trauma as a younger man.  Patient denies any fevers.   Records reviewed. Per medical record review patient has a history of hypertension.  Past Medical History:  Diagnosis Date  . Hypertension     Patient Active Problem List   Diagnosis Date Noted  . Essential hypertension 04/21/2015  . Sciatica 04/21/2015  . Tobacco abuse 04/21/2015  . Fracture of rib of right side 03/27/2015    Past Surgical History:  Procedure Laterality Date  . APPENDECTOMY      Prior to Admission medications   Medication Sig Start Date End Date Taking? Authorizing Provider  acetaminophen-codeine (TYLENOL #3) 300-30 MG tablet Take 1-2 tablets by mouth every 6 (six) hours as needed for moderate pain. 03/27/15   Hoy Register, MD  amLODipine (NORVASC) 10 MG tablet Take 1 tablet (10 mg total) by mouth daily. 04/21/15   Hoy Register, MD  doxycycline (MONODOX) 100 MG capsule Take 1 capsule (100 mg total) by mouth 2 (two) times daily. 11/24/18   Joni Reining, PA-C  methocarbamol (ROBAXIN-750) 750 MG tablet Take 1 tablet (750 mg total) by mouth every 8 (eight) hours as needed for muscle spasms. 03/27/15   Hoy Register, MD  oxybutynin (DITROPAN XL)  10 MG 24 hr tablet Take 1 tablet (10 mg total) by mouth daily. 11/24/18 11/24/19  Joni Reining, PA-C  phenazopyridine (PYRIDIUM) 200 MG tablet Take 1 tablet (200 mg total) by mouth 3 (three) times daily as needed for pain. 11/24/18   Joni Reining, PA-C    Allergies Patient has no known allergies.  No family history on file.  Social History Social History   Tobacco Use  . Smoking status: Current Some Day Smoker    Packs/day: 0.25  . Smokeless tobacco: Never Used  Substance Use Topics  . Alcohol use: Not Currently  . Drug use: No    Types: Marijuana    Review of Systems Constitutional: No fever/chills Eyes: No visual changes. ENT: No sore throat. Cardiovascular: Denies chest pain. Respiratory: Denies shortness of breath. Gastrointestinal: No abdominal pain.  No nausea, no vomiting.  No diarrhea.   Genitourinary: Negative for dysuria. Musculoskeletal: Negative for back pain. Skin: Negative for rash. Neurological: Positive for headache. ____________________________________________   PHYSICAL EXAM:  VITAL SIGNS: ED Triage Vitals  Enc Vitals Group     BP 11/03/19 1608 (!) 133/101     Pulse Rate 11/03/19 1608 (!) 103     Resp 11/03/19 1608 18     Temp 11/03/19 1608 98.3 F (36.8 C)     Temp Source 11/03/19 1608 Oral     SpO2 11/03/19 1608 98 %     Weight 11/03/19 1609 200 lb (90.7 kg)     Height  11/03/19 1609 5\' 11"  (1.803 m)     Head Circumference --      Peak Flow --      Pain Score 11/03/19 1615 7    Constitutional: Alert and oriented.  Eyes: Conjunctivae are normal.  ENT      Head: Normocephalic and atraumatic.      Nose: No congestion/rhinnorhea.      Mouth/Throat: Mucous membranes are moist.      Neck: No stridor. Hematological/Lymphatic/Immunilogical: No cervical lymphadenopathy. Cardiovascular: Normal rate, regular rhythm.  No murmurs, rubs, or gallops.  Respiratory: Normal respiratory effort without tachypnea nor retractions. Breath sounds are  clear and equal bilaterally. No wheezes/rales/rhonchi. Gastrointestinal: Soft and non tender. No rebound. No guarding.  Genitourinary: Deferred Musculoskeletal: Normal range of motion in all extremities. No lower extremity edema. Neurologic:  Normal speech and language. No gross focal neurologic deficits are appreciated.  Skin:  Skin is warm, dry and intact. No rash noted. Psychiatric: Mood and affect are normal. Speech and behavior are normal. Patient exhibits appropriate insight and judgment.  ____________________________________________    LABS (pertinent positives/negatives)  CK 138 CMP wnl except na 134, co2 19, glu 200 CBC wbc 9.5, hgb 15.1, plt 222 UA clear, small hgb, trace leukocytes, 0-5 rbc and wbc  ____________________________________________   EKG  None  ____________________________________________    RADIOLOGY  CT head Unremarkable non contrast ct head  ____________________________________________   PROCEDURES  Procedures  ____________________________________________   INITIAL IMPRESSION / ASSESSMENT AND PLAN / ED COURSE  Pertinent labs & imaging results that were available during my care of the patient were reviewed by me and considered in my medical decision making (see chart for details).   Patient presented to the ER because of concern for headache. On exam no focal neuro deficits  Blood work without any concerning abnormalities. Head CT negative. Patient was given IV fluids and medication did feel better.  This point will plan on discharge.  Did give patient primary care follow-up and discussed elevated sugar with patient here.  At this point would not start treatment although encourage patient to follow-up on his sugars.   ____________________________________________   FINAL CLINICAL IMPRESSION(S) / ED DIAGNOSES  Final diagnoses:  Bad headache     Note: This dictation was prepared with Dragon dictation. Any transcriptional errors that  result from this process are unintentional     11/05/19, MD 11/03/19 2321

## 2019-11-04 ENCOUNTER — Telehealth: Payer: Self-pay | Admitting: General Practice

## 2019-11-04 NOTE — Telephone Encounter (Signed)
Individual has [employer] and is ineligible to become a pt in the clinic.

## 2020-12-03 IMAGING — CT CT HEAD W/O CM
3 series · 15 of 47 positions shown, 18 images · non-contrast
Comparison: Maxillofacial CT 02/07/2013.

CLINICAL DATA: Headache, chronic, new features or increased
frequency. Additional history provided: Patient reports headache for
4 days with nausea.

EXAM:
CT HEAD WITHOUT CONTRAST
TECHNIQUE: Contiguous axial images were obtained from the base of the skull
through the vertex without intravenous contrast.

[Series 3: head wo · axial · 0.42mm/px · z∈[-93,+37]mm · 9 of 32 slices shown, 12 images]
[im 3/32  brain]
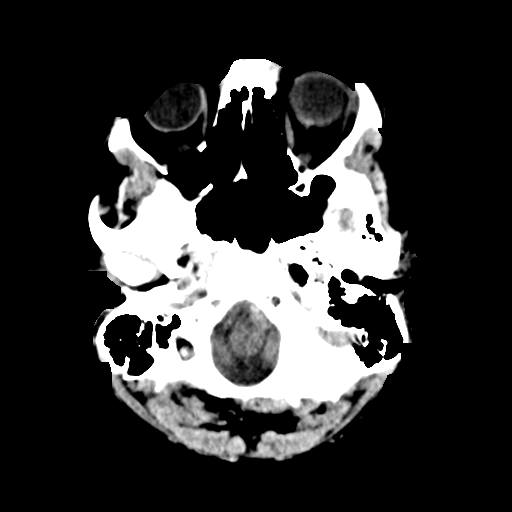
[im 3/32  bone]
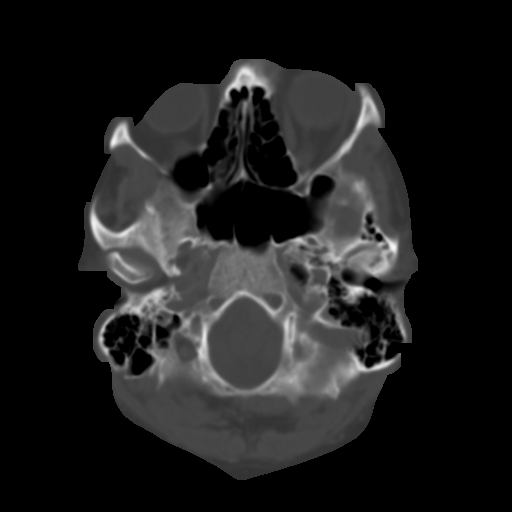
[im 6/32  brain]
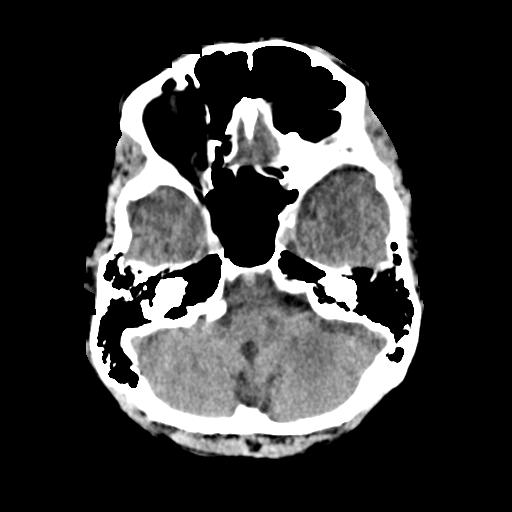
[im 9/32  brain]
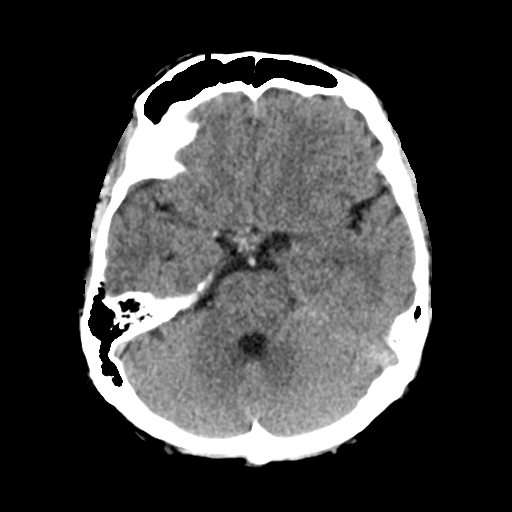
[im 12/32  brain]
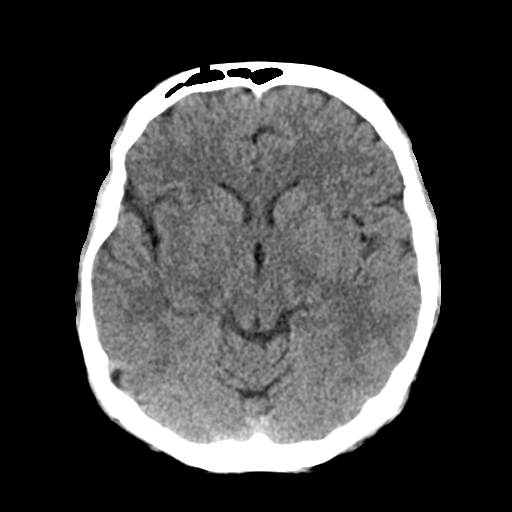
[im 17/32  brain]
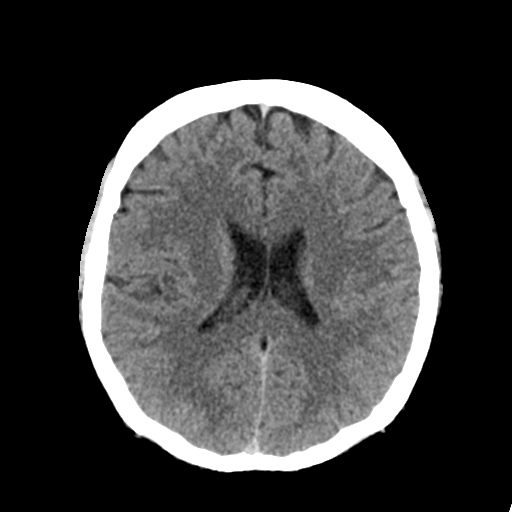
[im 17/32  bone]
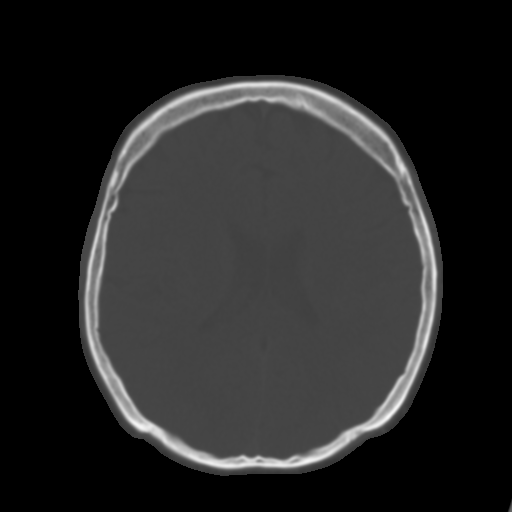
[im 20/32  brain]
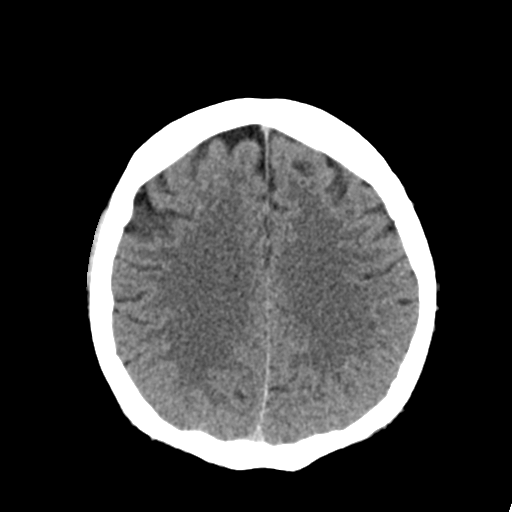
[im 23/32  brain]
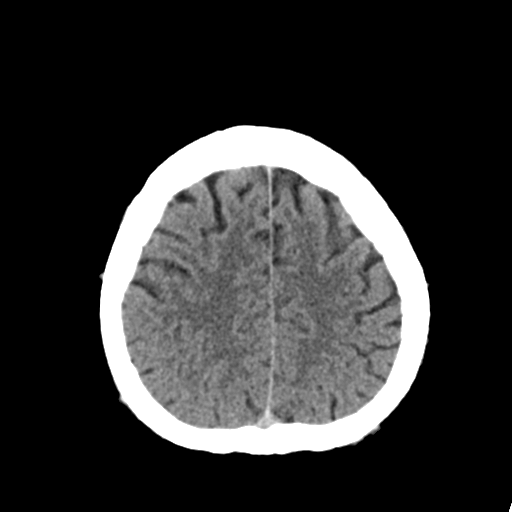
[im 26/32  brain]
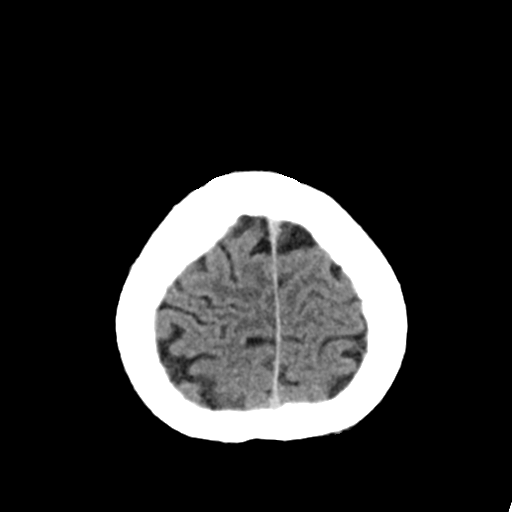
[im 29/32  brain]
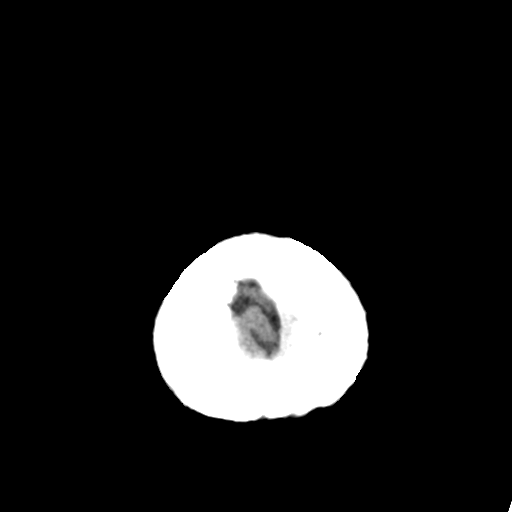
[im 29/32  bone]
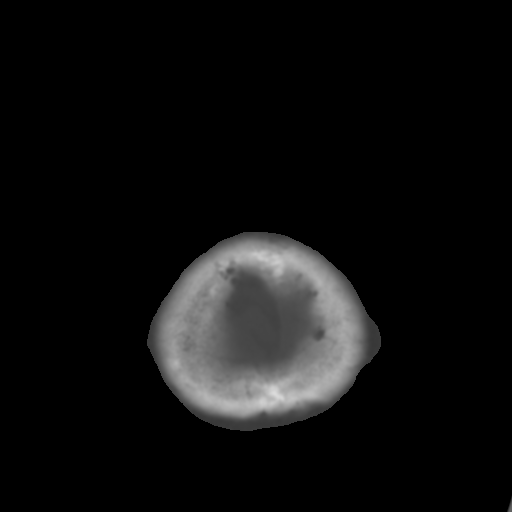

[Series 4: coronal soft tissue · coronal · 0.33mm/px · 3 of 60 slices shown]
[im 20/60  brain]
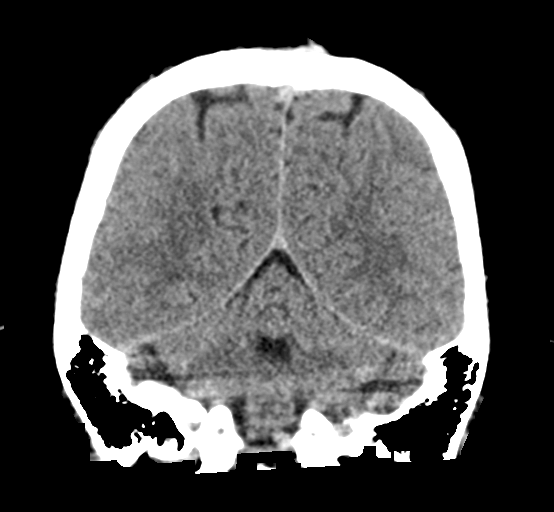
[im 27/60  brain]
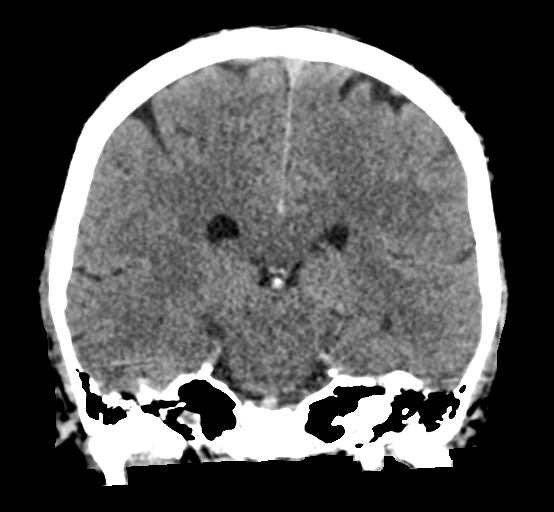
[im 33/60  brain]
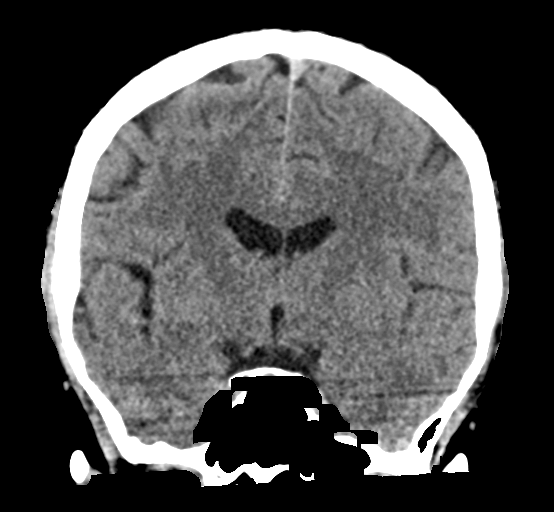

[Series 5: sagittal soft tissue · sagittal · 0.31mm/px · 3 of 56 slices shown]
[im 19/56  brain]
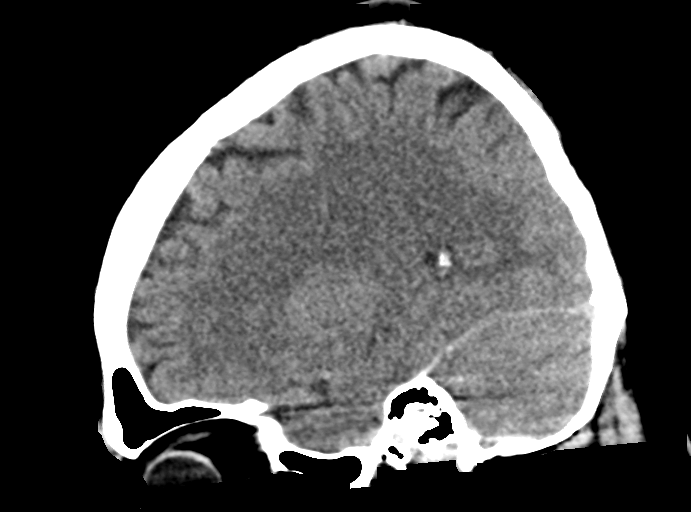
[im 28/56  brain]
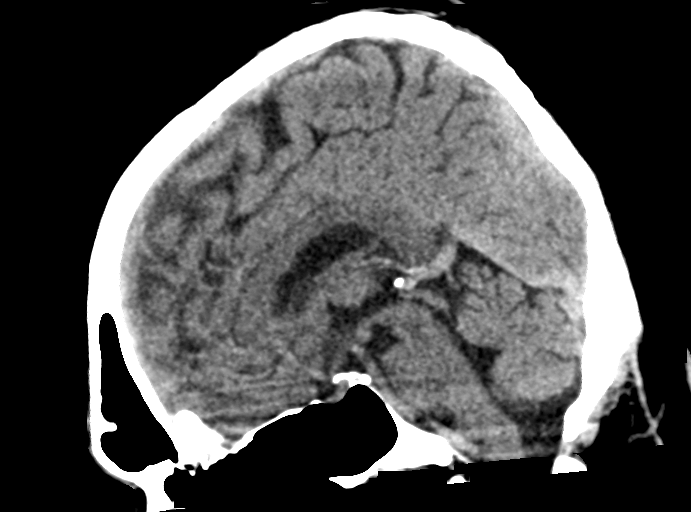
[im 37/56  brain]
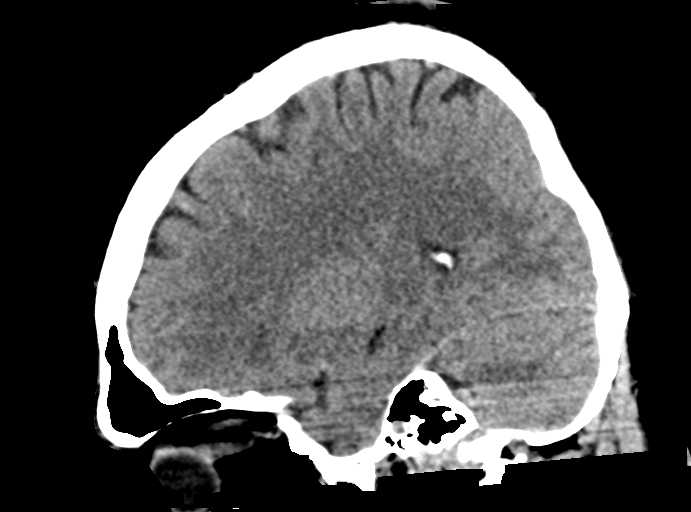

[15 of 47 positions shown; findings below may reference images not displayed]

FINDINGS: Brain:

Cerebral volume is normal.

There is no acute intracranial hemorrhage.

No demarcated cortical infarct.

No extra-axial fluid collection.

No evidence of intracranial mass.

No midline shift.

Vascular: No hyperdense vessel.

Skull: Normal. Negative for fracture or focal lesion.

Sinuses/Orbits: Visualized orbits show no acute finding. No
significant paranasal sinus disease or mastoid effusion at the
imaged levels.
IMPRESSION: Unremarkable non-contrast CT appearance of the brain. No evidence of
acute intracranial abnormality.
